# Patient Record
Sex: Female | Born: 1960 | Race: White | Hispanic: No | Marital: Married | Smoking: Never smoker
Health system: Southern US, Community
[De-identification: ages and names within clinical notes are randomized; demographics above are authoritative.]

## PROBLEM LIST (undated history)

## (undated) DIAGNOSIS — E079 Disorder of thyroid, unspecified: Secondary | ICD-10-CM

---

## 1999-03-19 ENCOUNTER — Other Ambulatory Visit: Admission: RE | Admit: 1999-03-19 | Discharge: 1999-03-19 | Payer: Self-pay | Admitting: Gynecology

## 1999-10-15 ENCOUNTER — Other Ambulatory Visit: Admission: RE | Admit: 1999-10-15 | Discharge: 1999-10-15 | Payer: Self-pay | Admitting: Gynecology

## 2000-06-16 ENCOUNTER — Other Ambulatory Visit: Admission: RE | Admit: 2000-06-16 | Discharge: 2000-06-16 | Payer: Self-pay | Admitting: Gynecology

## 2001-01-26 ENCOUNTER — Other Ambulatory Visit: Admission: RE | Admit: 2001-01-26 | Discharge: 2001-01-26 | Payer: Self-pay | Admitting: Gynecology

## 2004-01-31 ENCOUNTER — Other Ambulatory Visit: Admission: RE | Admit: 2004-01-31 | Discharge: 2004-01-31 | Payer: Self-pay | Admitting: Gynecology

## 2004-02-15 ENCOUNTER — Encounter: Admission: RE | Admit: 2004-02-15 | Discharge: 2004-02-15 | Payer: Self-pay | Admitting: Gynecology

## 2004-02-19 ENCOUNTER — Encounter (HOSPITAL_COMMUNITY): Admission: RE | Admit: 2004-02-19 | Discharge: 2004-05-19 | Payer: Self-pay | Admitting: Endocrinology

## 2004-03-13 ENCOUNTER — Ambulatory Visit (HOSPITAL_COMMUNITY): Admission: RE | Admit: 2004-03-13 | Discharge: 2004-03-13 | Payer: Self-pay | Admitting: Diagnostic Radiology

## 2004-11-20 ENCOUNTER — Ambulatory Visit (HOSPITAL_COMMUNITY): Admission: RE | Admit: 2004-11-20 | Discharge: 2004-11-20 | Payer: Self-pay | Admitting: Endocrinology

## 2004-11-22 ENCOUNTER — Ambulatory Visit (HOSPITAL_COMMUNITY): Admission: RE | Admit: 2004-11-22 | Discharge: 2004-11-22 | Payer: Self-pay | Admitting: Endocrinology

## 2005-07-29 ENCOUNTER — Other Ambulatory Visit: Admission: RE | Admit: 2005-07-29 | Discharge: 2005-07-29 | Payer: Self-pay | Admitting: Gynecology

## 2005-07-31 ENCOUNTER — Ambulatory Visit (HOSPITAL_COMMUNITY): Admission: RE | Admit: 2005-07-31 | Discharge: 2005-07-31 | Payer: Self-pay | Admitting: Gynecology

## 2005-11-07 ENCOUNTER — Ambulatory Visit (HOSPITAL_COMMUNITY): Admission: RE | Admit: 2005-11-07 | Discharge: 2005-11-08 | Payer: Self-pay | Admitting: *Deleted

## 2008-02-14 IMAGING — US US TRANSVAGINAL NON-OB
1 series · 17 of 25 positions shown · non-contrast
Comparison: none

CLINICAL DATA: Pelvic mass felt on clinical exam.  
 TRANSABDOMINAL AND TRANSVAGINAL PELVIC ULTRASOUND:
TECHNIQUE: Both transabdominal and transvaginal ultrasound examinations of the pelvis were performed including evaluation of the uterus, ovaries, adnexal regions, and pelvic cul-de-sac.

[Series 1: us transvaginal non-ob · 17 of 53 slices shown]
[im 1/53]
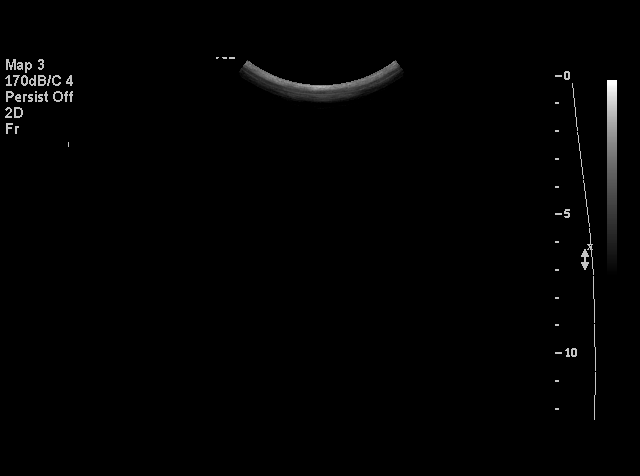
[im 5/53]
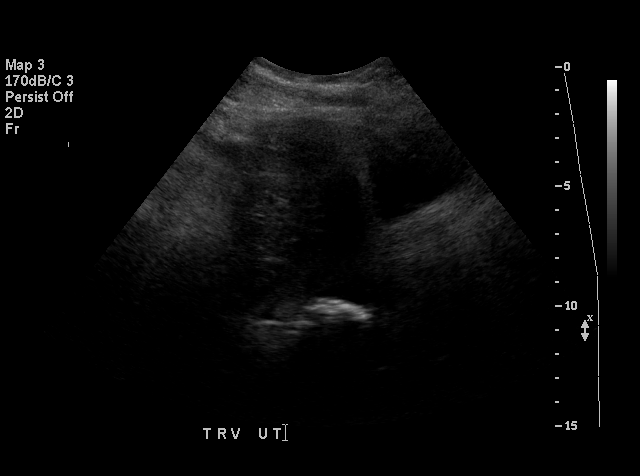
[im 7/53]
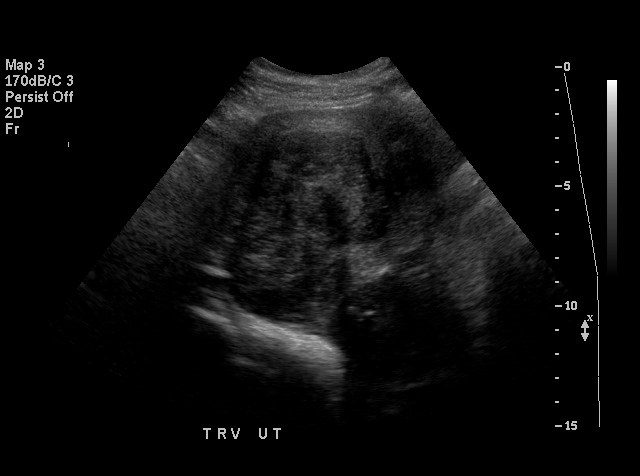
[im 11/53]
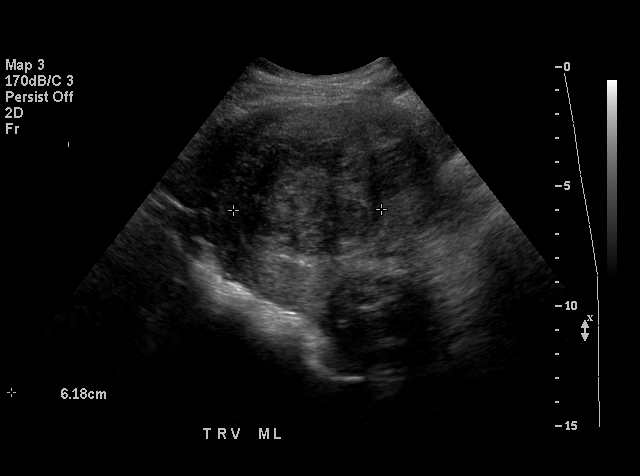
[im 14/53]
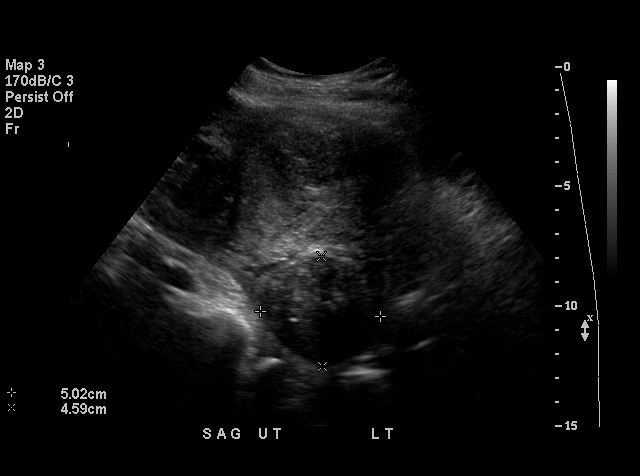
[im 18/53]
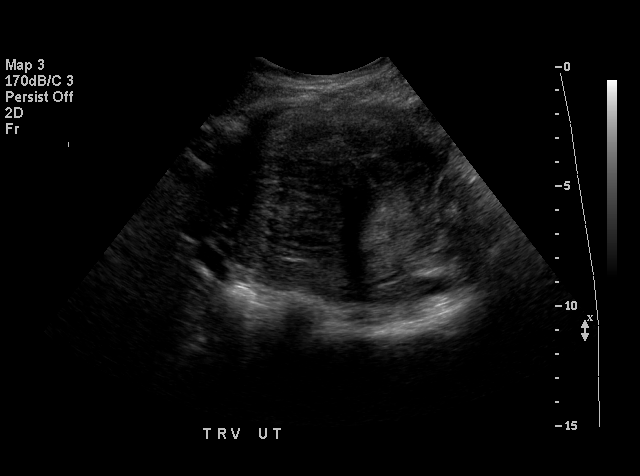
[im 20/53]
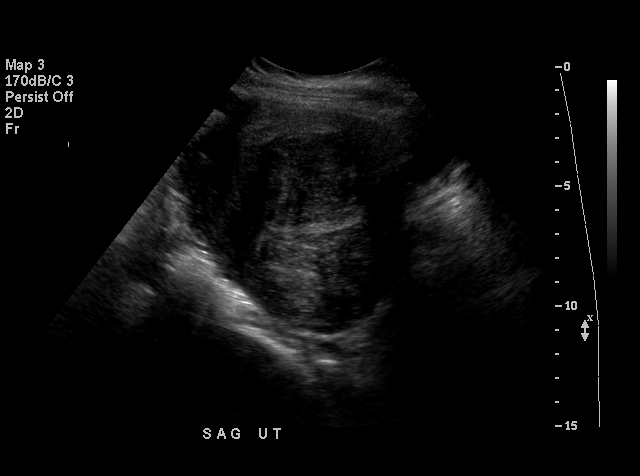
[im 24/53]
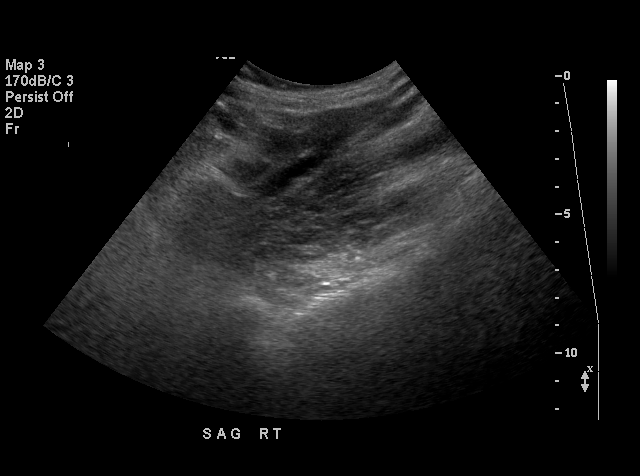
[im 27/53]
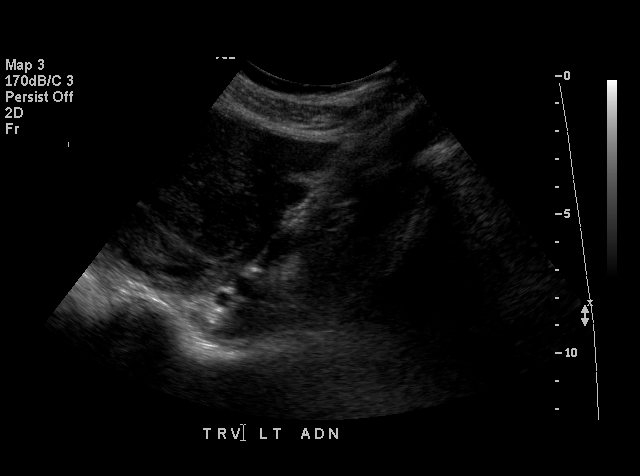
[im 29/53]
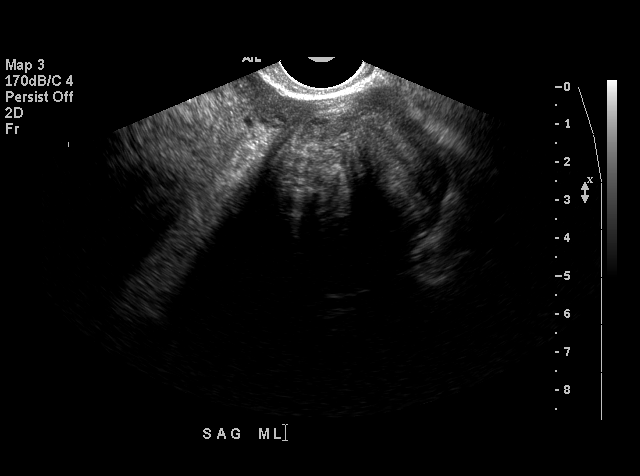
[im 33/53]
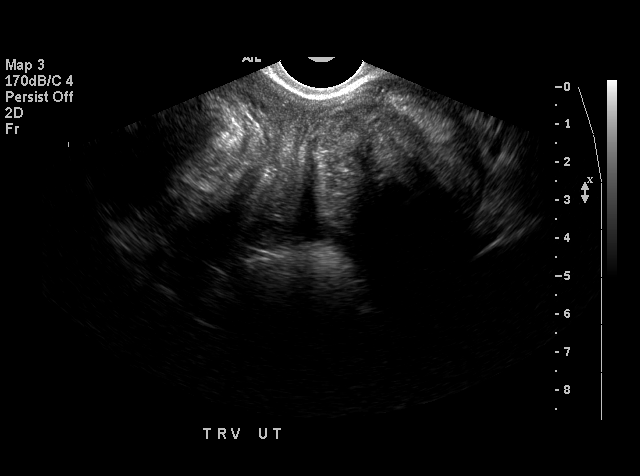
[im 35/53]
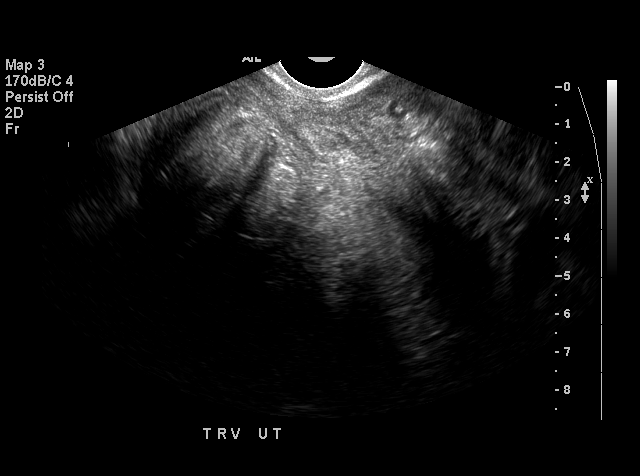
[im 40/53]
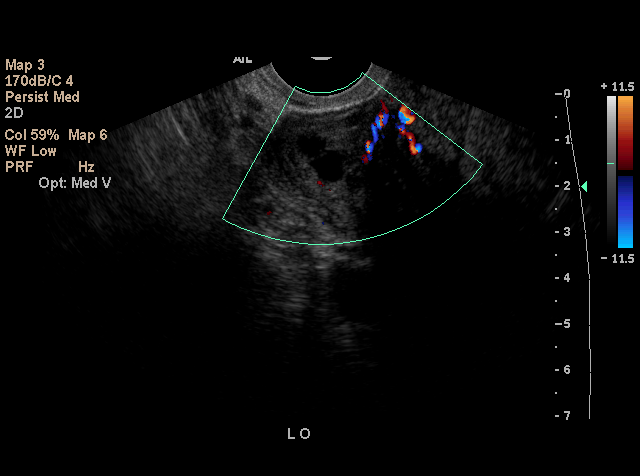
[im 42/53]
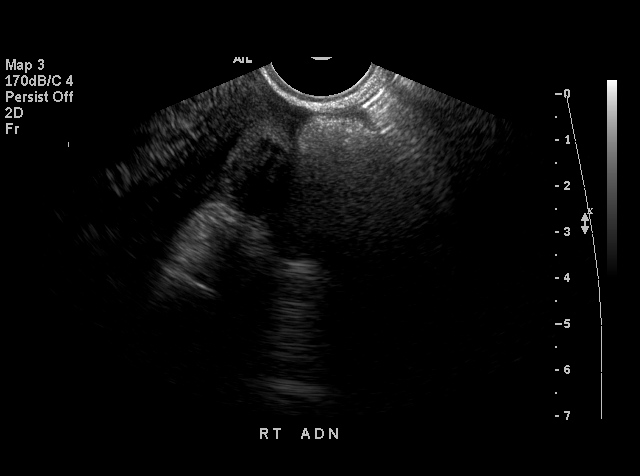
[im 46/53]
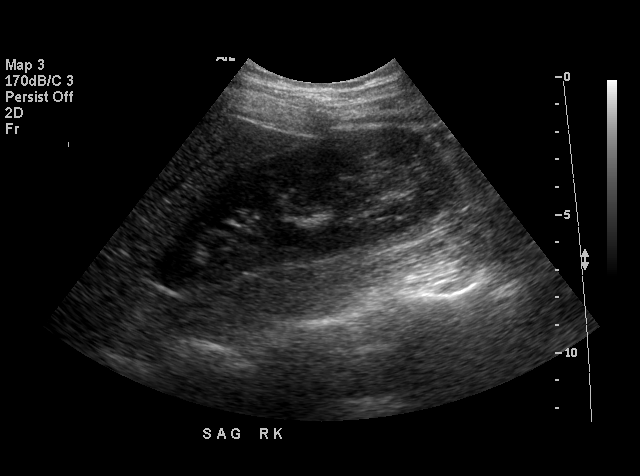
[im 48/53]
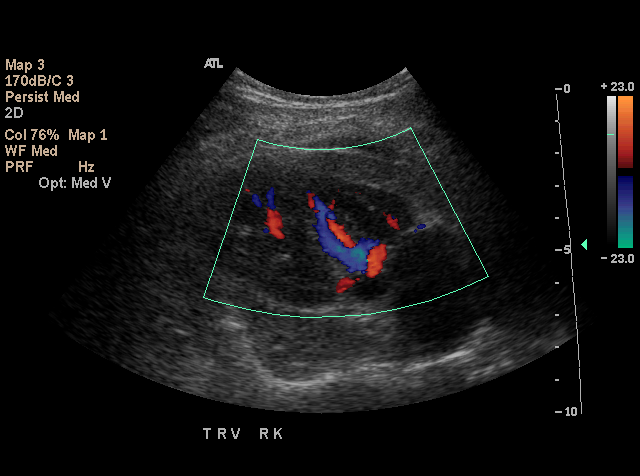
[im 53/53]
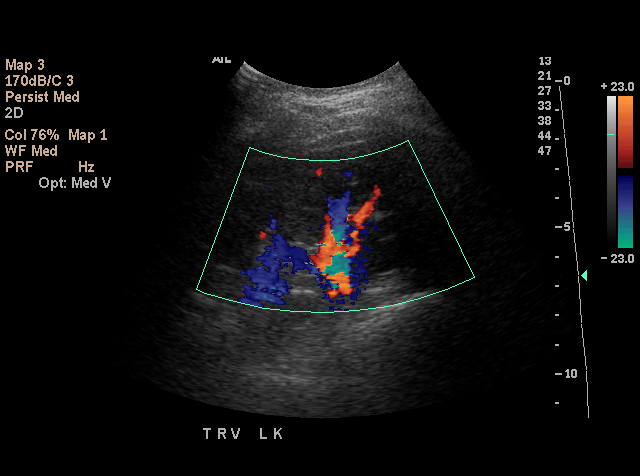

[17 of 25 positions shown; findings below may reference images not displayed]

FINDINGS: The uterus has an approximate sagittal length of 13 cm and an approximate AP width of 9.9 cm and a transverse width of 9.5 cm.  The uterine myometrium demonstrates multiple areas of focally altered echotexture compatible with focal fibroids.  The largest have sizes and locations as follows:  In the central upper uterine segment measuring 6.2 x 6.3 x 4.8 cm, in the left posterior fundal region measuring 4.3 x 4.4 x 5.7 cm, and in the left posterior lower uterine segment measuring 5.0 x 4.6 x 4.6 cm.   Both left posterior fibroids appear to have a partial subserosal component.  The remainder of the uterine myometrium appears more diffusely inhomogeneous suggesting more diffuse fibroid involvement.  
 The endometrial canal could not be well delineated either transabdominally or endovaginally due to the presence of these multiple fibroids.  If further information regarding the relationship of the fibroids with the underlying endometrial is desired, MRI would be useful for further evaluation.
 The left ovary was well seen endovaginally measuring 2.3 x 1.6 x 2.3 cm and has a normal appearance.  The right ovary could not be seen with confidence either transabdominally or endovaginally.  
 No definite separate adnexal masses are noted and no pelvic fluid was seen.  
 Because of the large uterine size, evaluation of the kidneys was undertaken.  No signs of associated hydronephrosis are seen.
IMPRESSION: 1.  Fibroid uterus with the three largest fibroids having sizes and locations as noted above.   The remainder of the myometrium is diffusely inhomogeneous as well suggesting a more diffuse fibroid involvement.  
 2.  The endometrium could not be successfully evaluated with ultrasound.
 3.  Normal left ovary and nonvisualized right ovary.

## 2016-10-07 DIAGNOSIS — E039 Hypothyroidism, unspecified: Secondary | ICD-10-CM | POA: Diagnosis not present

## 2016-10-14 DIAGNOSIS — E039 Hypothyroidism, unspecified: Secondary | ICD-10-CM | POA: Diagnosis not present

## 2016-12-29 MED FILL — LEVOTHYROXINE 100 MCG TABLE: 100 | 90 days supply | Qty: 90 | Fill #0

## 2017-01-23 DIAGNOSIS — K5120 Ulcerative (chronic) proctitis without complications: Secondary | ICD-10-CM | POA: Diagnosis not present

## 2017-03-16 MED FILL — LEVOTHYROXINE 100 MCG TABLE: 100 | 90 days supply | Qty: 90 | Fill #1

## 2017-06-17 MED FILL — LEVOTHYROXINE 100 MCG TABLE: 100 | 90 days supply | Qty: 90 | Fill #2

## 2017-09-22 DIAGNOSIS — E890 Postprocedural hypothyroidism: Secondary | ICD-10-CM | POA: Diagnosis not present

## 2017-09-22 MED FILL — LEVOTHYROXINE 100 MCG TABLE: 100 | 90 days supply | Qty: 90 | Fill #0

## 2017-12-03 DIAGNOSIS — Z1211 Encounter for screening for malignant neoplasm of colon: Secondary | ICD-10-CM | POA: Diagnosis not present

## 2017-12-03 DIAGNOSIS — Z23 Encounter for immunization: Secondary | ICD-10-CM | POA: Diagnosis not present

## 2017-12-03 DIAGNOSIS — Z1159 Encounter for screening for other viral diseases: Secondary | ICD-10-CM | POA: Diagnosis not present

## 2017-12-03 DIAGNOSIS — E890 Postprocedural hypothyroidism: Secondary | ICD-10-CM | POA: Diagnosis not present

## 2017-12-03 DIAGNOSIS — Z0000 Encounter for general adult medical examination without abnormal findings: Secondary | ICD-10-CM | POA: Diagnosis not present

## 2017-12-09 ENCOUNTER — Other Ambulatory Visit: Payer: Self-pay | Admitting: Family Medicine

## 2017-12-09 DIAGNOSIS — Z1231 Encounter for screening mammogram for malignant neoplasm of breast: Secondary | ICD-10-CM

## 2017-12-13 DIAGNOSIS — W57XXXA Bitten or stung by nonvenomous insect and other nonvenomous arthropods, initial encounter: Secondary | ICD-10-CM | POA: Diagnosis not present

## 2017-12-13 DIAGNOSIS — S31815A Open bite of right buttock, initial encounter: Secondary | ICD-10-CM | POA: Diagnosis not present

## 2017-12-13 DIAGNOSIS — L03317 Cellulitis of buttock: Secondary | ICD-10-CM | POA: Diagnosis not present

## 2017-12-15 MED FILL — LEVOTHYROXINE 100 MCG TABLE: 100 | 90 days supply | Qty: 90 | Fill #1

## 2018-03-18 MED FILL — LEVOTHYROXINE 100 MCG TABLE: 100 | 90 days supply | Qty: 90 | Fill #2

## 2018-06-14 MED FILL — LEVOTHYROXINE 100 MCG TABLE: 100 | 90 days supply | Qty: 90 | Fill #3

## 2018-08-11 DIAGNOSIS — E890 Postprocedural hypothyroidism: Secondary | ICD-10-CM | POA: Diagnosis not present

## 2018-09-16 MED FILL — LEVOTHYROXINE 100 MCG TAB: 100 | 90 days supply | Qty: 90 | Fill #0

## 2018-10-17 ENCOUNTER — Encounter (HOSPITAL_COMMUNITY): Payer: Self-pay

## 2018-10-17 ENCOUNTER — Emergency Department (HOSPITAL_COMMUNITY)
Admission: EM | Admit: 2018-10-17 | Discharge: 2018-10-17 | Disposition: A | Discharge status: Home / Self Care | Payer: 59 | Attending: Emergency Medicine | Admitting: Emergency Medicine

## 2018-10-17 ENCOUNTER — Other Ambulatory Visit: Payer: Self-pay

## 2018-10-17 DIAGNOSIS — Z23 Encounter for immunization: Secondary | ICD-10-CM | POA: Insufficient documentation

## 2018-10-17 DIAGNOSIS — Y999 Unspecified external cause status: Secondary | ICD-10-CM | POA: Insufficient documentation

## 2018-10-17 DIAGNOSIS — Y92000 Kitchen of unspecified non-institutional (private) residence as  the place of occurrence of the external cause: Secondary | ICD-10-CM | POA: Insufficient documentation

## 2018-10-17 DIAGNOSIS — S61211A Laceration without foreign body of left index finger without damage to nail, initial encounter: Secondary | ICD-10-CM | POA: Diagnosis not present

## 2018-10-17 DIAGNOSIS — W260XXA Contact with knife, initial encounter: Secondary | ICD-10-CM | POA: Diagnosis not present

## 2018-10-17 DIAGNOSIS — Y93G1 Activity, food preparation and clean up: Secondary | ICD-10-CM | POA: Diagnosis not present

## 2018-10-17 HISTORY — DX: Disorder of thyroid, unspecified: E07.9

## 2018-10-17 MED ORDER — BACITRACIN ZINC 500 UNIT/GM EX OINT
TOPICAL_OINTMENT | Freq: Once | CUTANEOUS | Status: AC
  Administered 2018-10-17: 1 via TOPICAL
  Filled 2018-10-17: qty 0.9

## 2018-10-17 MED ORDER — LIDOCAINE HCL (PF) 1 % IJ SOLN
10.0000 mL | Freq: Once | INTRAMUSCULAR | Status: AC
  Administered 2018-10-17: 10 mL via INTRADERMAL
  Filled 2018-10-17: qty 30

## 2018-10-17 MED ORDER — TETANUS-DIPHTH-ACELL PERTUSSIS 5-2.5-18.5 LF-MCG/0.5 IM SUSP
0.5000 mL | Freq: Once | INTRAMUSCULAR | Status: AC
  Administered 2018-10-17: 19:00:00 0.5 mL via INTRAMUSCULAR
  Filled 2018-10-17: qty 0.5

## 2018-10-17 NOTE — Discharge Instructions (Signed)
Keep the wound clean and dry for the first 24 hours. After that you may gently clean the wound with soap and water. Make sure to pat dry the wound before covering it with any dressing. You can use topical antibiotic ointment and bandage. Ice and elevate for pain relief.  ° °You can take Tylenol or Ibuprofen as directed for pain. You can alternate Tylenol and Ibuprofen every 4 hours for additional pain relief.  ° °Return to the Emergency Department, your primary care doctor, or the [facility] in 5-7 days for suture removal.  ° °Monitor closely for any signs of infection. Return to the Emergency Department for any worsening redness/swelling of the area that begins to spread, drainage from the site, worsening pain, fever or any other worsening or concerning symptoms.  ° ° °

## 2018-10-17 NOTE — ED Triage Notes (Signed)
Pt was cutting food and got her left index finger with a knife. Bleeding controlled at this time. Unsure of exact date of tetanus/

## 2018-10-17 NOTE — ED Provider Notes (Signed)
[facility]-EMERGENCY DEPT Provider Note   CSN: [id number] Arrival date & time: 10/17/18  1823    History   Chief Complaint Chief Complaint  Patient presents with  . Laceration    HPI Margorie JohnJoanne R Dawson is a 58 y.o. female who presents for evaluation of laceration to left index finger that occurred about an hour prior to ED arrival.  Patient states she was cooking with a knife and states that the knife slipped and cut the distal aspect of her left index finger.  She does not know when her last tetanus shot is.  She is not currently on blood thinners.  She denies any numbness/weakness.    The history is provided by the patient.    Past Medical History:  Diagnosis Date  . Thyroid disease    graves    There are no active problems to display for this patient.   Past Surgical History:  Procedure Laterality Date  . ABDOMINAL HYSTERECTOMY       OB History   No obstetric history on file.      Home Medications    Prior to Admission medications   Not on File    Family History No family history on file.  Social History Social History   Tobacco Use  . Smoking status: Never Smoker  . Smokeless tobacco: Never Used  Substance Use Topics  . Alcohol use: Yes    Comment: socially  . Drug use: Never     Allergies   Patient has no known allergies.   Review of Systems Review of Systems  Skin: Positive for wound.  Neurological: Negative for weakness and numbness.  All other systems reviewed and are negative.    Physical Exam Updated Vital Signs BP (!) 138/94 (BP Location: Right Arm)   Pulse 80   Temp 98.4 F (36.9 C) (Oral)   Resp 14   Ht 5\' 9"  (1.753 m)   Wt 69.4 kg   SpO2 100%   BMI 22.59 kg/m   Physical Exam Vitals signs and nursing note reviewed.  Constitutional:      Appearance: She is well-developed.  HENT:     Head: Normocephalic and atraumatic.  Eyes:     General: No scleral icterus.       Right eye: No discharge.         Left eye: No discharge.     Conjunctiva/sclera: Conjunctivae normal.  Cardiovascular:     Pulses:          Radial pulses are 2+ on the right side and 2+ on the left side.  Pulmonary:     Effort: Pulmonary effort is normal.  Musculoskeletal:     Comments: Full range of motion of left index finger without any difficulty.  Flexion/tension of DIP and PIP intact without any difficulty.  She can easily make a fist.  Skin:    General: Skin is warm and dry.     Capillary Refill: Capillary refill takes less than 2 seconds.     Comments: Good distal cap refill. LUE is not dusky in appearance or cool to touch.  2 cm curvilinear laceration noted to the volar surface of the distal left index finger.   Neurological:     Mental Status: She is alert.  Psychiatric:        Speech: Speech normal.        Behavior: Behavior normal.      ED Treatments / Results  Labs (all labs ordered are listed, but  only abnormal results are displayed) Labs Reviewed - No data to display  EKG None  Radiology No results found.  Procedures .Marland KitchenLaceration Repair Date/Time: 10/17/2018 7:53 PM Performed by: Volanda Napoleon, PA-C Authorized by: Volanda Napoleon, PA-C   Consent:    Consent obtained:  Verbal   Consent given by:  Patient   Risks discussed:  Infection, need for additional repair, pain, poor cosmetic result and poor wound healing   Alternatives discussed:  No treatment and delayed treatment Universal protocol:    Procedure explained and questions answered to patient or proxy's satisfaction: yes     Relevant documents present and verified: yes     Test results available and properly labeled: yes     Imaging studies available: yes     Required blood products, implants, devices, and special equipment available: yes     Site/side marked: yes     Immediately prior to procedure, a time out was called: yes     Patient identity confirmed:  Verbally with patient Anesthesia (see MAR for exact dosages):     Anesthesia method:  Local infiltration   Local anesthetic:  Lidocaine 1% w/o epi Laceration details:    Location:  Finger   Finger location:  L index finger   Length (cm):  2 Repair type:    Repair type:  Intermediate Pre-procedure details:    Preparation:  Patient was prepped and draped in usual sterile fashion Exploration:    Hemostasis achieved with:  Direct pressure and tourniquet   Wound exploration: wound explored through full range of motion     Wound extent: no foreign bodies/material noted and no tendon damage noted     Contaminated: no   Treatment:    Area cleansed with:  Betadine   Amount of cleaning:  Extensive   Irrigation solution:  Sterile saline   Irrigation method:  Syringe   Visualized foreign bodies/material removed: no   Skin repair:    Repair method:  Sutures   Suture size:  4-0   Suture material:  Nylon   Suture technique:  Simple interrupted   Number of sutures:  5 Approximation:    Approximation:  Close Post-procedure details:    Dressing:  Antibiotic ointment, non-adherent dressing and splint for protection   Patient tolerance of procedure:  Tolerated well, no immediate complications Comments:     Once the wound was anesthetized, sterilely extensively irrigated with sterile saline.  Evaluation of the wound with blood control from tourniquet showed no evidence of tendon injury.  Full evaluation of the wound showed no evidence of foreign body.  Full exploration of the wound showed no evidence of bony abnormality.  The wound was superficial and did not extend down into the bone.   (including critical care time)  Medications Ordered in ED Medications  Tdap (BOOSTRIX) injection 0.5 mL (0.5 mLs Intramuscular Given 10/17/18 1912)  lidocaine (PF) (XYLOCAINE) 1 % injection 10 mL (10 mLs Intradermal Given 10/17/18 1912)  bacitracin ointment (1 application Topical Given 10/17/18 2007)     Initial Impression / Assessment and Plan / ED Course  I have reviewed  the triage vital signs and the nursing notes.  Pertinent labs & imaging results that were available during my care of the patient were reviewed by me and considered in my medical decision making (see chart for details).        58 year old female who presents for evaluation of left index finger laceration that occurred 1 hour prior to ED arrival.  Unsure when last tetanus. Patient is afebrile, non-toxic appearing, sitting comfortably on examination table. Vital signs reviewed and stable. Patient is neurovascularly intact.  Plan for wound care, tetanus update.  Laceration repaired as documented above.  Patient tolerated procedure well.  Gust with patient regarding life that she was using.  She assures that it was very clean and was not treated.  I did discuss with patient regarding possible antibiotic given that it was a knife.  Using shared decision-making.  Patient wishes not have any antibiotics at this time.  Given evaluation, I feel this is reasonable. Patient expresses understanding of risk vs benefits and wishes to decline antibiotics. At this time, patient exhibits no emergent life-threatening condition that require further evaluation in ED or admission. Patient had ample opportunity for questions and discussion. All patient's questions were answered with full understanding.  Portions of this note were generated with Scientist, clinical (histocompatibility and immunogenetics)Dragon dictation software. Dictation errors may occur despite best attempts at proofreading.   Final Clinical Impressions(s) / ED Diagnoses   Final diagnoses:  Laceration of left index finger without foreign body without damage to nail, initial encounter    ED Discharge Orders    None       Rosana HoesLayden,  A, PA-C 10/18/18 0056    Little, Ambrose Finlandachel Morgan, MD 10/18/18 2112

## 2018-12-11 MED FILL — LEVOTHYROXINE 100 MCG TAB: 100 | 90 days supply | Qty: 90 | Fill #1

## 2019-03-14 MED FILL — LEVOTHYROXINE 100 MCG TAB: 100 | 90 days supply | Qty: 90 | Fill #2

## 2019-06-15 MED FILL — LEVOTHYROXINE 100 MCG TAB: 100 | 90 days supply | Qty: 90 | Fill #3

## 2019-07-28 DIAGNOSIS — E890 Postprocedural hypothyroidism: Secondary | ICD-10-CM | POA: Diagnosis not present

## 2019-07-29 ENCOUNTER — Other Ambulatory Visit (HOSPITAL_COMMUNITY): Payer: Self-pay | Admitting: Family Medicine

## 2019-09-15 MED FILL — LEVOTHYROXINE 100 MCG TABLE: 100 | 90 days supply | Qty: 90 | Fill #0

## 2019-12-06 MED FILL — LEVOTHYROXINE 100 MCG TABLE: 100 | 90 days supply | Qty: 90 | Fill #1

## 2019-12-14 DIAGNOSIS — M5030 Other cervical disc degeneration, unspecified cervical region: Secondary | ICD-10-CM | POA: Diagnosis not present

## 2019-12-14 DIAGNOSIS — M47812 Spondylosis without myelopathy or radiculopathy, cervical region: Secondary | ICD-10-CM | POA: Diagnosis not present

## 2019-12-14 DIAGNOSIS — M25511 Pain in right shoulder: Secondary | ICD-10-CM | POA: Diagnosis not present

## 2019-12-14 MED FILL — METHOCARBAMOL 500 MG TABS: 500 | 7 days supply | Qty: 40 | Fill #0

## 2019-12-14 MED FILL — predniSONE 5 MG TABS: 5 | 6 days supply | Qty: 21 | Fill #0

## 2019-12-28 DIAGNOSIS — M5412 Radiculopathy, cervical region: Secondary | ICD-10-CM | POA: Diagnosis not present

## 2019-12-28 DIAGNOSIS — M25511 Pain in right shoulder: Secondary | ICD-10-CM | POA: Diagnosis not present

## 2020-01-04 DIAGNOSIS — M5412 Radiculopathy, cervical region: Secondary | ICD-10-CM | POA: Diagnosis not present

## 2020-01-04 DIAGNOSIS — M25511 Pain in right shoulder: Secondary | ICD-10-CM | POA: Diagnosis not present

## 2020-01-09 DIAGNOSIS — M5412 Radiculopathy, cervical region: Secondary | ICD-10-CM | POA: Diagnosis not present

## 2020-01-09 DIAGNOSIS — M25511 Pain in right shoulder: Secondary | ICD-10-CM | POA: Diagnosis not present

## 2020-01-31 MED FILL — METHOCARBAMOL 500 MG TABS: 500 | 20 days supply | Qty: 60 | Fill #0

## 2020-02-22 DIAGNOSIS — M9901 Segmental and somatic dysfunction of cervical region: Secondary | ICD-10-CM | POA: Diagnosis not present

## 2020-02-22 DIAGNOSIS — M5411 Radiculopathy, occipito-atlanto-axial region: Secondary | ICD-10-CM | POA: Diagnosis not present

## 2020-02-27 DIAGNOSIS — M5411 Radiculopathy, occipito-atlanto-axial region: Secondary | ICD-10-CM | POA: Diagnosis not present

## 2020-02-27 DIAGNOSIS — M9901 Segmental and somatic dysfunction of cervical region: Secondary | ICD-10-CM | POA: Diagnosis not present

## 2020-02-28 ENCOUNTER — Other Ambulatory Visit (HOSPITAL_COMMUNITY): Payer: Self-pay | Admitting: Physician Assistant

## 2020-02-28 DIAGNOSIS — M5411 Radiculopathy, occipito-atlanto-axial region: Secondary | ICD-10-CM | POA: Diagnosis not present

## 2020-02-28 DIAGNOSIS — M9901 Segmental and somatic dysfunction of cervical region: Secondary | ICD-10-CM | POA: Diagnosis not present

## 2020-02-28 MED FILL — predniSONE 10 MG TABS: 10 | 6 days supply | Qty: 21 | Fill #0

## 2020-02-28 MED FILL — METHOCARBAMOL 500 MG TABS: 500 | 20 days supply | Qty: 60 | Fill #0

## 2020-02-29 DIAGNOSIS — M5411 Radiculopathy, occipito-atlanto-axial region: Secondary | ICD-10-CM | POA: Diagnosis not present

## 2020-02-29 DIAGNOSIS — M9901 Segmental and somatic dysfunction of cervical region: Secondary | ICD-10-CM | POA: Diagnosis not present

## 2020-03-05 DIAGNOSIS — M9901 Segmental and somatic dysfunction of cervical region: Secondary | ICD-10-CM | POA: Diagnosis not present

## 2020-03-05 DIAGNOSIS — M5411 Radiculopathy, occipito-atlanto-axial region: Secondary | ICD-10-CM | POA: Diagnosis not present

## 2020-03-06 DIAGNOSIS — M9901 Segmental and somatic dysfunction of cervical region: Secondary | ICD-10-CM | POA: Diagnosis not present

## 2020-03-06 DIAGNOSIS — M5411 Radiculopathy, occipito-atlanto-axial region: Secondary | ICD-10-CM | POA: Diagnosis not present

## 2020-03-12 DIAGNOSIS — M5411 Radiculopathy, occipito-atlanto-axial region: Secondary | ICD-10-CM | POA: Diagnosis not present

## 2020-03-12 DIAGNOSIS — M9901 Segmental and somatic dysfunction of cervical region: Secondary | ICD-10-CM | POA: Diagnosis not present

## 2020-03-13 MED FILL — LEVOTHYROXINE 100 MCG TABLE: 100 | 90 days supply | Qty: 90 | Fill #2

## 2020-03-14 DIAGNOSIS — M9901 Segmental and somatic dysfunction of cervical region: Secondary | ICD-10-CM | POA: Diagnosis not present

## 2020-03-14 DIAGNOSIS — M5411 Radiculopathy, occipito-atlanto-axial region: Secondary | ICD-10-CM | POA: Diagnosis not present

## 2020-03-19 DIAGNOSIS — M5411 Radiculopathy, occipito-atlanto-axial region: Secondary | ICD-10-CM | POA: Diagnosis not present

## 2020-03-19 DIAGNOSIS — M9901 Segmental and somatic dysfunction of cervical region: Secondary | ICD-10-CM | POA: Diagnosis not present

## 2020-03-21 DIAGNOSIS — M9901 Segmental and somatic dysfunction of cervical region: Secondary | ICD-10-CM | POA: Diagnosis not present

## 2020-03-21 DIAGNOSIS — M5411 Radiculopathy, occipito-atlanto-axial region: Secondary | ICD-10-CM | POA: Diagnosis not present

## 2020-03-26 DIAGNOSIS — M5411 Radiculopathy, occipito-atlanto-axial region: Secondary | ICD-10-CM | POA: Diagnosis not present

## 2020-03-26 DIAGNOSIS — M9901 Segmental and somatic dysfunction of cervical region: Secondary | ICD-10-CM | POA: Diagnosis not present

## 2020-03-28 DIAGNOSIS — M9901 Segmental and somatic dysfunction of cervical region: Secondary | ICD-10-CM | POA: Diagnosis not present

## 2020-03-28 DIAGNOSIS — M5411 Radiculopathy, occipito-atlanto-axial region: Secondary | ICD-10-CM | POA: Diagnosis not present

## 2020-04-02 DIAGNOSIS — M9901 Segmental and somatic dysfunction of cervical region: Secondary | ICD-10-CM | POA: Diagnosis not present

## 2020-04-02 DIAGNOSIS — M5411 Radiculopathy, occipito-atlanto-axial region: Secondary | ICD-10-CM | POA: Diagnosis not present

## 2020-04-04 DIAGNOSIS — M5411 Radiculopathy, occipito-atlanto-axial region: Secondary | ICD-10-CM | POA: Diagnosis not present

## 2020-04-04 DIAGNOSIS — M9901 Segmental and somatic dysfunction of cervical region: Secondary | ICD-10-CM | POA: Diagnosis not present

## 2020-04-09 DIAGNOSIS — M9901 Segmental and somatic dysfunction of cervical region: Secondary | ICD-10-CM | POA: Diagnosis not present

## 2020-04-09 DIAGNOSIS — M5411 Radiculopathy, occipito-atlanto-axial region: Secondary | ICD-10-CM | POA: Diagnosis not present

## 2020-04-16 DIAGNOSIS — M5411 Radiculopathy, occipito-atlanto-axial region: Secondary | ICD-10-CM | POA: Diagnosis not present

## 2020-04-16 DIAGNOSIS — M9901 Segmental and somatic dysfunction of cervical region: Secondary | ICD-10-CM | POA: Diagnosis not present

## 2020-04-23 DIAGNOSIS — M9901 Segmental and somatic dysfunction of cervical region: Secondary | ICD-10-CM | POA: Diagnosis not present

## 2020-04-23 DIAGNOSIS — M5411 Radiculopathy, occipito-atlanto-axial region: Secondary | ICD-10-CM | POA: Diagnosis not present

## 2020-05-01 DIAGNOSIS — M5411 Radiculopathy, occipito-atlanto-axial region: Secondary | ICD-10-CM | POA: Diagnosis not present

## 2020-05-01 DIAGNOSIS — M9901 Segmental and somatic dysfunction of cervical region: Secondary | ICD-10-CM | POA: Diagnosis not present

## 2020-05-08 DIAGNOSIS — M9901 Segmental and somatic dysfunction of cervical region: Secondary | ICD-10-CM | POA: Diagnosis not present

## 2020-05-08 DIAGNOSIS — M5411 Radiculopathy, occipito-atlanto-axial region: Secondary | ICD-10-CM | POA: Diagnosis not present

## 2020-05-15 DIAGNOSIS — M9901 Segmental and somatic dysfunction of cervical region: Secondary | ICD-10-CM | POA: Diagnosis not present

## 2020-05-15 DIAGNOSIS — M5411 Radiculopathy, occipito-atlanto-axial region: Secondary | ICD-10-CM | POA: Diagnosis not present

## 2020-05-22 DIAGNOSIS — M9901 Segmental and somatic dysfunction of cervical region: Secondary | ICD-10-CM | POA: Diagnosis not present

## 2020-05-22 DIAGNOSIS — M5411 Radiculopathy, occipito-atlanto-axial region: Secondary | ICD-10-CM | POA: Diagnosis not present

## 2020-05-29 DIAGNOSIS — M9901 Segmental and somatic dysfunction of cervical region: Secondary | ICD-10-CM | POA: Diagnosis not present

## 2020-05-29 DIAGNOSIS — M5411 Radiculopathy, occipito-atlanto-axial region: Secondary | ICD-10-CM | POA: Diagnosis not present

## 2020-06-05 DIAGNOSIS — M9901 Segmental and somatic dysfunction of cervical region: Secondary | ICD-10-CM | POA: Diagnosis not present

## 2020-06-05 DIAGNOSIS — M5411 Radiculopathy, occipito-atlanto-axial region: Secondary | ICD-10-CM | POA: Diagnosis not present

## 2020-06-14 DIAGNOSIS — M5411 Radiculopathy, occipito-atlanto-axial region: Secondary | ICD-10-CM | POA: Diagnosis not present

## 2020-06-14 DIAGNOSIS — M9901 Segmental and somatic dysfunction of cervical region: Secondary | ICD-10-CM | POA: Diagnosis not present

## 2020-06-14 MED FILL — LEVOTHYROXINE 100 MCG TABLE: 100 | 90 days supply | Qty: 90 | Fill #3

## 2020-06-28 DIAGNOSIS — M9901 Segmental and somatic dysfunction of cervical region: Secondary | ICD-10-CM | POA: Diagnosis not present

## 2020-06-28 DIAGNOSIS — M5411 Radiculopathy, occipito-atlanto-axial region: Secondary | ICD-10-CM | POA: Diagnosis not present

## 2020-07-10 DIAGNOSIS — M9901 Segmental and somatic dysfunction of cervical region: Secondary | ICD-10-CM | POA: Diagnosis not present

## 2020-07-10 DIAGNOSIS — M5411 Radiculopathy, occipito-atlanto-axial region: Secondary | ICD-10-CM | POA: Diagnosis not present

## 2020-07-11 MED FILL — METHOCARBAMOL 500 MG TABS: 500 | 20 days supply | Qty: 60 | Fill #1

## 2020-07-24 DIAGNOSIS — M5411 Radiculopathy, occipito-atlanto-axial region: Secondary | ICD-10-CM | POA: Diagnosis not present

## 2020-07-24 DIAGNOSIS — M9901 Segmental and somatic dysfunction of cervical region: Secondary | ICD-10-CM | POA: Diagnosis not present

## 2020-08-08 ENCOUNTER — Other Ambulatory Visit (HOSPITAL_COMMUNITY): Payer: Self-pay | Admitting: Family Medicine

## 2020-08-08 DIAGNOSIS — M5411 Radiculopathy, occipito-atlanto-axial region: Secondary | ICD-10-CM | POA: Diagnosis not present

## 2020-08-08 DIAGNOSIS — M9901 Segmental and somatic dysfunction of cervical region: Secondary | ICD-10-CM | POA: Diagnosis not present

## 2020-08-08 DIAGNOSIS — E890 Postprocedural hypothyroidism: Secondary | ICD-10-CM | POA: Diagnosis not present

## 2020-08-21 DIAGNOSIS — M9901 Segmental and somatic dysfunction of cervical region: Secondary | ICD-10-CM | POA: Diagnosis not present

## 2020-08-21 DIAGNOSIS — M5411 Radiculopathy, occipito-atlanto-axial region: Secondary | ICD-10-CM | POA: Diagnosis not present

## 2020-09-04 ENCOUNTER — Ambulatory Visit (INDEPENDENT_AMBULATORY_CARE_PROVIDER_SITE_OTHER): Payer: 59 | Admitting: Family Medicine

## 2020-09-04 DIAGNOSIS — G8929 Other chronic pain: Secondary | ICD-10-CM | POA: Diagnosis not present

## 2020-09-04 DIAGNOSIS — M25511 Pain in right shoulder: Secondary | ICD-10-CM | POA: Diagnosis not present

## 2020-09-04 NOTE — Progress Notes (Signed)
Office Visit Note   Patient: Crystal Dawson           Date of Birth: [birth year]           MRN: [id number] Visit Date: 09/04/2020 Requested by: No referring provider defined for this encounter. PCP: Patient, No Pcp Per (Inactive)  Subjective: Chief Complaint  Patient presents with  . Right Shoulder - Pain    Pain in the around her right scapula x 1 year. Has been seeing Dr. Lianne Cure for her neck (and Dr. Beverely Low prior to that and had some PT - 2 visits + home exercises). NKI    HPI: 60yo F presenting to clinic with 30 years of intermittent right posterior shoulder/scapular pain, which has worsened over the past year. Patient states that she has always struggled with pain in this area, but she was crocheting much more during the pandemic, which she feels made her symptoms much worse. Since this exacerbation started, she has seen orthopedics, who gave her a steroid burst with some improvement. She has worked with physical therapy, therapeutic massage, and 6 months of chiropractic care. This, unfortunately, has helped a little, but her symptoms have continued to bother her daily. No numbness or weakness in the hand.               ROS:   All other systems were reviewed and are negative.  Objective: Vital Signs: There were no vitals taken for this visit.  Physical Exam:  General:  Alert and oriented, in no acute distress. Pulm:  Breathing unlabored. Psy:  Normal mood, congruent affect. Skin:  Upper back/neck with no bruising, rashes, or erythema. Overlying skin intact.   Upper Back:  Neck and shoulders with full ROM without pain.  Apply scratch symmetric to approximately level of T5 bilaterally, and scapular motion is smooth bilaterally.  Tenderness to palpation most significant throughout right periscapular rhomboids and inferior trapezius. Multiple trigger points appreciated in this area.  No pain with empty can testing of the shoulder, or resisted internal or external rotation.   5 out of 5 strength with shoulder abduction, elbow flexion/extension, and wrist flexion/extension.  Sensation is intact.   Imaging: No results found.  Assessment & Plan: 60yo F with one year of right periscapular pain, with multiple rhomboid trigger points bilaterally. Suspect these trigger points are a source of her lingering pain. Discussed trigger point injections, and patient opted to proceed.  - CSI performed as described below, which patient tolerated well. - Discussed self accu-pressure techniques with tennis or lacrosse ball - If no improvement, would consider Cervical/Thoracic MRI      Procedures: Right Rhomboid Trigger Point Cortisone Injection:  Risks and benefits of procedure discussed, Patient opted to proceed. Verbal Consent obtained.  Timeout performed.  Overlying skin prepped with alcohol. Ethyl Chloride was used for topical analgesia.  Point of maximal tenderness in Right rhomboids was injected with 4cc 1% Lidocaine without epinephrine, followed by 40mg  methylprednisolone, using a 25G 1in needle.  Patient tolerated the injection well with no immediate complications. Aftercare instructions were discussed, and patient was given strict return precautions.     PMFS History: There are no problems to display for this patient.  Past Medical History:  Diagnosis Date  . Thyroid disease    graves    No family history on file.  Past Surgical History:  Procedure Laterality Date  . ABDOMINAL HYSTERECTOMY     Social History   Occupational History  . Not on file  Tobacco Use  . Smoking status: Never Smoker  . Smokeless tobacco: Never Used  Substance and Sexual Activity  . Alcohol use: Yes    Comment: socially  . Drug use: Never  . Sexual activity: Not on file

## 2020-09-04 NOTE — Progress Notes (Signed)
I saw and examined the patient with Dr. Marga Hoots and agree with assessment and plan as outlined.    Chronic right shoulder blade pain.  Tried PT briefly, then chiro.  Chiro helps, but is not eliminating the pain.  Had x-rays of neck per Dr. Ranell Patrick.  Will inject rhomboid trigger point today.  Start Vitamin D3.  MRI of cervical spine if fails to improve.

## 2020-09-04 NOTE — Patient Instructions (Signed)
   Vitamin D3:  Take 5,000 IU daily    

## 2020-09-11 ENCOUNTER — Other Ambulatory Visit (HOSPITAL_COMMUNITY): Payer: Self-pay

## 2020-09-11 MED FILL — Levothyroxine Sodium Tab 100 MCG: ORAL | 90 days supply | Qty: 90 | Fill #0 | Status: AC

## 2020-11-30 ENCOUNTER — Other Ambulatory Visit (HOSPITAL_COMMUNITY): Payer: Self-pay

## 2020-11-30 MED FILL — Levothyroxine Sodium Tab 100 MCG: ORAL | 90 days supply | Qty: 90 | Fill #1 | Status: AC

## 2020-12-18 DIAGNOSIS — M542 Cervicalgia: Secondary | ICD-10-CM | POA: Diagnosis not present

## 2020-12-18 DIAGNOSIS — M25511 Pain in right shoulder: Secondary | ICD-10-CM | POA: Diagnosis not present

## 2021-01-04 DIAGNOSIS — M542 Cervicalgia: Secondary | ICD-10-CM | POA: Diagnosis not present

## 2021-01-11 ENCOUNTER — Other Ambulatory Visit (HOSPITAL_COMMUNITY): Payer: Self-pay

## 2021-01-11 MED ORDER — GABAPENTIN 300 MG PO CAPS
300.0000 mg | ORAL_CAPSULE | Freq: Every day | ORAL | 1 refills | Status: AC
  Filled 2021-01-11: qty 30, 30d supply, fill #0
  Filled 2021-02-24: qty 30, 30d supply, fill #1

## 2021-01-23 DIAGNOSIS — M5412 Radiculopathy, cervical region: Secondary | ICD-10-CM | POA: Diagnosis not present

## 2021-01-29 DIAGNOSIS — M5412 Radiculopathy, cervical region: Secondary | ICD-10-CM | POA: Diagnosis not present

## 2021-02-05 DIAGNOSIS — M5412 Radiculopathy, cervical region: Secondary | ICD-10-CM | POA: Diagnosis not present

## 2021-02-25 ENCOUNTER — Other Ambulatory Visit (HOSPITAL_COMMUNITY): Payer: Self-pay

## 2021-03-14 ENCOUNTER — Other Ambulatory Visit (HOSPITAL_COMMUNITY): Payer: Self-pay

## 2021-03-14 MED FILL — Levothyroxine Sodium Tab 100 MCG: ORAL | 90 days supply | Qty: 90 | Fill #2 | Status: AC

## 2021-06-13 ENCOUNTER — Other Ambulatory Visit (HOSPITAL_COMMUNITY): Payer: Self-pay

## 2021-06-13 MED FILL — Levothyroxine Sodium Tab 100 MCG: ORAL | 90 days supply | Qty: 90 | Fill #3 | Status: AC

## 2021-08-12 DIAGNOSIS — E890 Postprocedural hypothyroidism: Secondary | ICD-10-CM | POA: Diagnosis not present

## 2021-08-22 ENCOUNTER — Other Ambulatory Visit (HOSPITAL_COMMUNITY): Payer: Self-pay

## 2021-08-22 MED ORDER — LEVOTHYROXINE SODIUM 100 MCG PO TABS
100.0000 ug | ORAL_TABLET | Freq: Every morning | ORAL | 3 refills | Status: DC
  Filled 2021-08-22 – 2021-09-04 (×2): qty 90, 90d supply, fill #0
  Filled 2021-12-03: qty 90, 90d supply, fill #1
  Filled 2022-03-16: qty 90, 90d supply, fill #2
  Filled 2022-06-20: qty 90, 90d supply, fill #3

## 2021-09-04 ENCOUNTER — Other Ambulatory Visit (HOSPITAL_COMMUNITY): Payer: Self-pay

## 2021-12-03 ENCOUNTER — Other Ambulatory Visit (HOSPITAL_COMMUNITY): Payer: Self-pay

## 2021-12-23 ENCOUNTER — Other Ambulatory Visit (HOSPITAL_COMMUNITY): Payer: Self-pay

## 2021-12-23 MED ORDER — GABAPENTIN 300 MG PO CAPS
300.0000 mg | ORAL_CAPSULE | Freq: Every day | ORAL | 1 refills | Status: DC
  Filled 2021-12-23: qty 30, 30d supply, fill #0
  Filled 2022-06-20: qty 30, 30d supply, fill #1

## 2022-03-17 ENCOUNTER — Other Ambulatory Visit (HOSPITAL_COMMUNITY): Payer: Self-pay

## 2022-06-20 ENCOUNTER — Other Ambulatory Visit (HOSPITAL_COMMUNITY): Payer: Self-pay

## 2022-08-15 ENCOUNTER — Other Ambulatory Visit (HOSPITAL_COMMUNITY): Payer: Self-pay

## 2022-08-15 DIAGNOSIS — M542 Cervicalgia: Secondary | ICD-10-CM | POA: Diagnosis not present

## 2022-08-15 DIAGNOSIS — L659 Nonscarring hair loss, unspecified: Secondary | ICD-10-CM | POA: Diagnosis not present

## 2022-08-15 DIAGNOSIS — E890 Postprocedural hypothyroidism: Secondary | ICD-10-CM | POA: Diagnosis not present

## 2022-08-15 MED ORDER — GABAPENTIN 300 MG PO CAPS
300.0000 mg | ORAL_CAPSULE | Freq: Every day | ORAL | 0 refills | Status: AC
  Filled 2022-08-15: qty 60, 60d supply, fill #0

## 2022-08-20 ENCOUNTER — Other Ambulatory Visit (HOSPITAL_COMMUNITY): Payer: Self-pay

## 2022-08-20 MED ORDER — LEVOTHYROXINE SODIUM 100 MCG PO TABS
100.0000 ug | ORAL_TABLET | ORAL | 3 refills | Status: AC
  Filled 2022-08-20: qty 90, 90d supply, fill #0

## 2022-08-22 ENCOUNTER — Other Ambulatory Visit (HOSPITAL_COMMUNITY): Payer: Self-pay

## 2022-08-22 MED ORDER — LEVOTHYROXINE SODIUM 100 MCG PO TABS
100.0000 ug | ORAL_TABLET | Freq: Every morning | ORAL | 3 refills | Status: DC
  Filled 2022-08-22 – 2022-09-03 (×2): qty 90, 90d supply, fill #0
  Filled 2022-12-16: qty 90, 90d supply, fill #1
  Filled 2023-03-22: qty 90, 90d supply, fill #2
  Filled 2023-06-19: qty 90, 90d supply, fill #3

## 2022-09-03 ENCOUNTER — Other Ambulatory Visit (HOSPITAL_COMMUNITY): Payer: Self-pay

## 2022-12-17 ENCOUNTER — Other Ambulatory Visit (HOSPITAL_COMMUNITY): Payer: Self-pay

## 2023-03-23 ENCOUNTER — Other Ambulatory Visit (HOSPITAL_COMMUNITY): Payer: Self-pay

## 2023-06-19 ENCOUNTER — Other Ambulatory Visit (HOSPITAL_COMMUNITY): Payer: Self-pay

## 2023-06-19 ENCOUNTER — Other Ambulatory Visit: Payer: Self-pay

## 2023-09-04 ENCOUNTER — Other Ambulatory Visit (HOSPITAL_COMMUNITY): Payer: Self-pay

## 2023-09-04 ENCOUNTER — Other Ambulatory Visit: Payer: Self-pay

## 2023-09-04 MED ORDER — METHOCARBAMOL 500 MG PO TABS
500.0000 mg | ORAL_TABLET | ORAL | 1 refills | Status: AC | PRN
  Filled 2023-09-04 (×2): qty 180, 30d supply, fill #0
  Filled 2024-03-23: qty 180, 30d supply, fill #1

## 2023-09-04 MED ORDER — LEVOTHYROXINE SODIUM 100 MCG PO TABS
100.0000 ug | ORAL_TABLET | Freq: Every morning | ORAL | 3 refills | Status: AC
  Filled 2023-09-04: qty 90, 90d supply, fill #0
  Filled 2023-12-21: qty 90, 90d supply, fill #1
  Filled 2024-03-23: qty 90, 90d supply, fill #2

## 2023-09-04 MED ORDER — GABAPENTIN 300 MG PO CAPS
300.0000 mg | ORAL_CAPSULE | Freq: Every day | ORAL | 1 refills | Status: AC | PRN
  Filled 2023-09-04: qty 60, 60d supply, fill #0
  Filled 2024-03-23: qty 60, 60d supply, fill #1

## 2023-09-08 ENCOUNTER — Other Ambulatory Visit (HOSPITAL_COMMUNITY): Payer: Self-pay

## 2023-12-21 ENCOUNTER — Other Ambulatory Visit (HOSPITAL_COMMUNITY): Payer: Self-pay

## 2024-03-23 ENCOUNTER — Other Ambulatory Visit: Payer: Self-pay

## 2024-03-23 ENCOUNTER — Other Ambulatory Visit (HOSPITAL_COMMUNITY): Payer: Self-pay

## 2024-03-24 ENCOUNTER — Other Ambulatory Visit (HOSPITAL_COMMUNITY): Payer: Self-pay
